# Patient Record
Sex: Male | Born: 1980 | Race: Black or African American | Hispanic: No | Marital: Single | Smoking: Current every day smoker
Health system: Southern US, Community
[De-identification: ages and names within clinical notes are randomized; demographics above are authoritative.]

## PROBLEM LIST (undated history)

## (undated) DIAGNOSIS — Z789 Other specified health status: Secondary | ICD-10-CM

## (undated) HISTORY — DX: Other specified health status: Z78.9

## (undated) HISTORY — PX: NO PAST SURGERIES: SHX2092

---

## 2012-04-07 ENCOUNTER — Emergency Department: Payer: Self-pay | Admitting: Emergency Medicine

## 2013-10-25 ENCOUNTER — Emergency Department: Payer: Self-pay | Admitting: Emergency Medicine

## 2013-10-25 LAB — URINALYSIS, COMPLETE
BILIRUBIN, UR: NEGATIVE
Bacteria: NEGATIVE
GLUCOSE, UR: NEGATIVE mg/dL (ref 0–75)
Ketone: NEGATIVE
Leukocyte Esterase: NEGATIVE
NITRITE: NEGATIVE
PH: 5 (ref 4.5–8.0)
Protein: NEGATIVE
Specific Gravity: 1.018 (ref 1.003–1.030)
WBC UR: NONE SEEN /HPF (ref 0–5)

## 2013-10-25 LAB — BASIC METABOLIC PANEL
Anion Gap: 2 — ABNORMAL LOW (ref 7–16)
BUN: 18 mg/dL (ref 7–18)
CALCIUM: 8.9 mg/dL (ref 8.5–10.1)
CHLORIDE: 109 mmol/L — AB (ref 98–107)
CO2: 27 mmol/L (ref 21–32)
Creatinine: 0.87 mg/dL (ref 0.60–1.30)
EGFR (African American): >60
EGFR (Non-African Amer.): >60
GLUCOSE: 84 mg/dL (ref 65–99)
Osmolality: 277 (ref 275–301)
POTASSIUM: 4 mmol/L (ref 3.5–5.1)
SODIUM: 138 mmol/L (ref 136–145)

## 2013-10-25 LAB — CBC
HCT: 42.4 % (ref 40.0–52.0)
HGB: 14.1 g/dL (ref 13.0–18.0)
MCH: 30.8 pg (ref 26.0–34.0)
MCHC: 33.3 g/dL (ref 32.0–36.0)
MCV: 93 fL (ref 80–100)
Platelet: 173 10*3/uL (ref 150–440)
RBC: 4.58 10*6/uL (ref 4.40–5.90)
RDW: 12.9 % (ref 11.5–14.5)
WBC: 6.2 10*3/uL (ref 3.8–10.6)

## 2017-02-15 ENCOUNTER — Encounter: Payer: Self-pay | Admitting: Emergency Medicine

## 2017-02-15 ENCOUNTER — Emergency Department
Admission: EM | Admit: 2017-02-15 | Discharge: 2017-02-15 | Disposition: A | Discharge status: Home / Self Care | Payer: BLUE CROSS/BLUE SHIELD | Attending: Emergency Medicine | Admitting: Emergency Medicine

## 2017-02-15 ENCOUNTER — Emergency Department: Payer: BLUE CROSS/BLUE SHIELD

## 2017-02-15 DIAGNOSIS — Y939 Activity, unspecified: Secondary | ICD-10-CM | POA: Insufficient documentation

## 2017-02-15 DIAGNOSIS — S299XXA Unspecified injury of thorax, initial encounter: Secondary | ICD-10-CM | POA: Diagnosis present

## 2017-02-15 DIAGNOSIS — Y999 Unspecified external cause status: Secondary | ICD-10-CM | POA: Insufficient documentation

## 2017-02-15 DIAGNOSIS — S29012A Strain of muscle and tendon of back wall of thorax, initial encounter: Secondary | ICD-10-CM | POA: Diagnosis not present

## 2017-02-15 DIAGNOSIS — X58XXXA Exposure to other specified factors, initial encounter: Secondary | ICD-10-CM | POA: Insufficient documentation

## 2017-02-15 DIAGNOSIS — R0789 Other chest pain: Secondary | ICD-10-CM

## 2017-02-15 DIAGNOSIS — F17210 Nicotine dependence, cigarettes, uncomplicated: Secondary | ICD-10-CM | POA: Diagnosis not present

## 2017-02-15 DIAGNOSIS — Y929 Unspecified place or not applicable: Secondary | ICD-10-CM | POA: Diagnosis not present

## 2017-02-15 DIAGNOSIS — S29019A Strain of muscle and tendon of unspecified wall of thorax, initial encounter: Secondary | ICD-10-CM

## 2017-02-15 LAB — BASIC METABOLIC PANEL
Anion gap: 8 (ref 5–15)
BUN: 15 mg/dL (ref 6–20)
CHLORIDE: 103 mmol/L (ref 101–111)
CO2: 27 mmol/L (ref 22–32)
CREATININE: 0.91 mg/dL (ref 0.61–1.24)
Calcium: 9.6 mg/dL (ref 8.9–10.3)
GFR calc non Af Amer: >60 mL/min (ref >60)
GLUCOSE: 97 mg/dL (ref 65–99)
Potassium: 3.7 mmol/L (ref 3.5–5.1)
Sodium: 138 mmol/L (ref 135–145)

## 2017-02-15 LAB — TROPONIN I: Troponin I: <0.03 ng/mL (ref <0.03)

## 2017-02-15 LAB — CBC
HCT: 40.9 % (ref 40.0–52.0)
Hemoglobin: 14.1 g/dL (ref 13.0–18.0)
MCH: 32 pg (ref 26.0–34.0)
MCHC: 34.4 g/dL (ref 32.0–36.0)
MCV: 93.1 fL (ref 80.0–100.0)
PLATELETS: 180 10*3/uL (ref 150–440)
RBC: 4.4 MIL/uL (ref 4.40–5.90)
RDW: 12.5 % (ref 11.5–14.5)
WBC: 7.3 10*3/uL (ref 3.8–10.6)

## 2017-02-15 MED ORDER — DIAZEPAM 5 MG PO TABS
10.0000 mg | ORAL_TABLET | Freq: Once | ORAL | Status: AC
  Administered 2017-02-15: 10 mg via ORAL
  Filled 2017-02-15: qty 2

## 2017-02-15 MED ORDER — IBUPROFEN 800 MG PO TABS: 800.0000 mg | ORAL_TABLET | Freq: Three times a day (TID) | ORAL | 0 refills | Status: DC | PRN

## 2017-02-15 MED ORDER — DIAZEPAM 5 MG PO TABS: 5.0000 mg | ORAL_TABLET | Freq: Three times a day (TID) | ORAL | 0 refills | Status: DC | PRN

## 2017-02-15 MED ORDER — KETOROLAC TROMETHAMINE 60 MG/2ML IM SOLN
60.0000 mg | Freq: Once | INTRAMUSCULAR | Status: AC
  Administered 2017-02-15: 60 mg via INTRAMUSCULAR
  Filled 2017-02-15: qty 2

## 2017-02-15 NOTE — ED Notes (Signed)
ED Provider at bedside. 

## 2017-02-15 NOTE — ED Triage Notes (Signed)
Pt c/o right back pain and central chest pain that is intermittent today. SHOB comes when pain does. Ambulatory to triage without difficulty. NAD at this time.  Skin warm and dry. Respirations unlabored.

## 2017-02-15 NOTE — ED Provider Notes (Signed)
[facility] Emergency Department Provider Note       Time seen: ----------------------------------------- 7:07 PM on 02/15/2017 -----------------------------------------     I have reviewed the triage vital signs and the nursing notes.   HISTORY   Chief Complaint Chest Pain    HPI Russell Phillips is a 36 y.o. male who presents to the ED for right back and chest pain that has been intermittent today. He has shortness of breath that comes and goes. Patient states earlier this morning he had the right upper back pain but then he took a nap and woke up with right-sided chest pain. He denies fevers, chills, vomiting or diarrhea. Patient does heavy lifting on his job.   History reviewed. No pertinent past medical history.  There are no active problems to display for this patient.   History reviewed. No pertinent surgical history.  Allergies Patient has no known allergies.  Social History Social History  Substance Use Topics  . Smoking status: Current Every Day Smoker  . Smokeless tobacco: Never Used  . Alcohol use Yes    Review of Systems Constitutional: Negative for fever. Eyes: Negative for vision changes ENT:  Negative for congestion, sore throat Cardiovascular: Positive for chest pain Respiratory: Negative for shortness of breath. Gastrointestinal: Negative for abdominal pain, vomiting and diarrhea. Genitourinary: Negative for dysuria. Musculoskeletal: Positive for right upper back pain Skin: Negative for rash. Neurological: Negative for headaches, focal weakness or numbness.  All systems negative/normal/unremarkable except as stated in the HPI  ____________________________________________   PHYSICAL EXAM:  VITAL SIGNS: ED Triage Vitals  Enc Vitals Group     BP 02/15/17 1620 124/60     Pulse Rate 02/15/17 1620 64     Resp 02/15/17 1620 16     Temp 02/15/17 1620 99 F (37.2 C)     Temp Source 02/15/17 1620 Oral     SpO2  02/15/17 1620 99 %     Weight 02/15/17 1618 160 lb (72.6 kg)     Height 02/15/17 1618 5\' 6"  (1.676 m)     Head Circumference --      Peak Flow --      Pain Score 02/15/17 1618 8     Pain Loc --      Pain Edu? --      Excl. in GC? --     Constitutional: Alert and oriented. Well appearing and in no distress. Eyes: Conjunctivae are normal. Normal extraocular movements. ENT   Head: Normocephalic and atraumatic.   Nose: No congestion/rhinnorhea.   Mouth/Throat: Mucous membranes are moist.   Neck: No stridor. Cardiovascular: Normal rate, regular rhythm. No murmurs, rubs, or gallops. Respiratory: Normal respiratory effort without tachypnea nor retractions. Breath sounds are clear and equal bilaterally. No wheezes/rales/rhonchi. Gastrointestinal: Soft and nontender. Normal bowel sounds Musculoskeletal: Nontender with normal range of motion in extremities. No lower extremity tenderness nor edema. Palpable muscle spasm in the thoracic spine and periscapular area, right anterolateral chest wall tenderness. Neurologic:  Normal speech and language. No gross focal neurologic deficits are appreciated.  Skin:  Skin is warm, dry and intact. No rash noted. Psychiatric: Mood and affect are normal. Speech and behavior are normal.  ____________________________________________  EKG: Interpreted by me. Sinus rhythm with a rate of 63 bpm, normal PR normal, normal QRS, normal QT.  ____________________________________________  ED COURSE:  Pertinent labs & imaging results that were available during my care of the patient were reviewed by me and considered in my medical decision making (see  chart for details). Patient presents for chest and back pain, we will assess with labs and imaging as indicated.   Procedures ____________________________________________   LABS (pertinent positives/negatives)  Labs Reviewed  BASIC METABOLIC PANEL  CBC  TROPONIN I    RADIOLOGY  Chest x-ray   IMPRESSION: Mild peribronchial cuffing. Mild bronchitis cannot be excluded. Exam is otherwise unremarkable.  ____________________________________________  FINAL ASSESSMENT AND PLAN  Thoracic muscle strain, chest wall pain  Plan: Patient's labs and imaging were dictated above. Patient had presented for chest and back pain which appears to be musculoskeletal as that he does heavy lifting. He was given anti-inflammatory muscle relaxants, is encouraged to use heating pad, massage and stretching.   Emily FilbertWilliams, Jonathan E, MD   Note: This note was generated in part or whole with voice recognition software. Voice recognition is usually quite accurate but there are transcription errors that can and very often do occur. I apologize for any typographical errors that were not detected and corrected.     Emily FilbertWilliams, Jonathan E, MD 02/15/17 [phone]

## 2017-07-05 ENCOUNTER — Emergency Department (HOSPITAL_COMMUNITY)
Admission: EM | Admit: 2017-07-05 | Discharge: 2017-07-05 | Disposition: A | Discharge status: Home / Self Care | Payer: BLUE CROSS/BLUE SHIELD | Attending: Emergency Medicine | Admitting: Emergency Medicine

## 2017-07-05 ENCOUNTER — Emergency Department (HOSPITAL_COMMUNITY): Payer: BLUE CROSS/BLUE SHIELD

## 2017-07-05 ENCOUNTER — Encounter (HOSPITAL_COMMUNITY): Payer: Self-pay | Admitting: Emergency Medicine

## 2017-07-05 DIAGNOSIS — M533 Sacrococcygeal disorders, not elsewhere classified: Secondary | ICD-10-CM | POA: Insufficient documentation

## 2017-07-05 DIAGNOSIS — F17200 Nicotine dependence, unspecified, uncomplicated: Secondary | ICD-10-CM | POA: Diagnosis not present

## 2017-07-05 DIAGNOSIS — M545 Low back pain, unspecified: Secondary | ICD-10-CM

## 2017-07-05 MED ORDER — IBUPROFEN 400 MG PO TABS
800.0000 mg | ORAL_TABLET | Freq: Once | ORAL | Status: AC
  Administered 2017-07-05: 800 mg via ORAL
  Filled 2017-07-05: qty 2

## 2017-07-05 NOTE — ED Notes (Signed)
Patient transported to X-ray 
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
## 2017-07-05 NOTE — Discharge Instructions (Signed)
Take Tylenol every 4 hours and ibuprofen every 6 hours as needed for pain. Use ice as needed. He will need a work note for today. He will be more sore tomorrow. If you develop new concerns or discover new injuries please return to the ER or see her physician.
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
## 2020-06-28 ENCOUNTER — Other Ambulatory Visit (INDEPENDENT_AMBULATORY_CARE_PROVIDER_SITE_OTHER): Payer: Self-pay | Admitting: Nurse Practitioner

## 2020-06-28 DIAGNOSIS — I8390 Asymptomatic varicose veins of unspecified lower extremity: Secondary | ICD-10-CM

## 2020-06-29 ENCOUNTER — Encounter (INDEPENDENT_AMBULATORY_CARE_PROVIDER_SITE_OTHER): Payer: Self-pay | Admitting: Nurse Practitioner

## 2020-06-29 ENCOUNTER — Other Ambulatory Visit: Payer: Self-pay

## 2020-06-29 ENCOUNTER — Ambulatory Visit (INDEPENDENT_AMBULATORY_CARE_PROVIDER_SITE_OTHER): Payer: BC Managed Care – PPO

## 2020-06-29 ENCOUNTER — Ambulatory Visit (INDEPENDENT_AMBULATORY_CARE_PROVIDER_SITE_OTHER): Payer: BC Managed Care – PPO | Admitting: Nurse Practitioner

## 2020-06-29 VITALS — BP 144/88 | HR 62 | Resp 16 | Ht 66.0 in | Wt 140.4 lb

## 2020-06-29 DIAGNOSIS — I8390 Asymptomatic varicose veins of unspecified lower extremity: Secondary | ICD-10-CM

## 2020-07-04 ENCOUNTER — Encounter (INDEPENDENT_AMBULATORY_CARE_PROVIDER_SITE_OTHER): Payer: Self-pay | Admitting: Nurse Practitioner

## 2020-07-04 NOTE — Progress Notes (Signed)
Subjective:    Patient ID: Russell Phillips, male    DOB: Sep 09, 1981, 39 y.o.   MRN: [id number] Chief Complaint  Patient presents with  . New Patient (Initial Visit)    ref [facility] ble reflux ultrasound     The patient is seen for evaluation of symptomatic varicose veins. The patient relates burning and stinging which worsened steadily throughout the course of the day, particularly with standing. The patient also notes an aching and throbbing pain over the varicosities, particularly with prolonged dependent positions. The symptoms are significantly improved with elevation.  The patient also notes that during hot weather the symptoms are greatly intensified and this is worsened within the last month. The patient states the pain from the varicose veins interferes with work, daily exercise, shopping and household maintenance. At this point, the symptoms are persistent and severe enough that they're having a negative impact on lifestyle and are interfering with daily activities.  There is no history of DVT, PE or superficial thrombophlebitis. There is no history of ulceration or hemorrhage. The patient denies a significant family history of varicose veins.   The patient has not worn graduated compression in the past. At the present time the patient has not been using over-the-counter analgesics. There is no history of prior surgical intervention or sclerotherapy.  Today noninvasive studies show no evidence of DVT or superficial venous stenosis bilaterally.  No evidence of superficial venous reflux seen in the bilateral great or short saphenous veins bilaterally.  No evidence of deep venous insufficiency seen bilaterally.  It is noted that there are multiple superficial varicosities in the medial aspect of the mid and distal thigh.   Review of Systems  Cardiovascular: Positive for leg swelling.  All other systems reviewed and are negative.      Objective:   Physical Exam Vitals reviewed.   HENT:     Head: Normocephalic.  Cardiovascular:     Rate and Rhythm: Normal rate.     Pulses: Normal pulses.     Comments: Large superficial varicosities in the left lower extremity Pulmonary:     Effort: Pulmonary effort is normal.  Musculoskeletal:        General: Tenderness present.  Neurological:     Mental Status: He is alert and oriented to person, place, and time.  Psychiatric:        Mood and Affect: Mood normal.        Behavior: Behavior normal.        Thought Content: Thought content normal.        Judgment: Judgment normal.     BP (!) 144/88 (BP Location: Right Arm)   Pulse 62   Resp 16   Ht 5\' 6"  (1.676 m)   Wt 140 lb 6.4 oz (63.7 kg)   BMI 22.66 kg/m   Past Medical History:  Diagnosis Date  . No pertinent past medical history     Social History   Socioeconomic History  . Marital status: Single    Spouse name: Not on file  . Number of children: Not on file  . Years of education: Not on file  . Highest education level: Not on file  Occupational History  . Not on file  Tobacco Use  . Smoking status: Current Every Day Smoker  . Smokeless tobacco: Never Used  Substance and Sexual Activity  . Alcohol use: Yes  . Drug use: No  . Sexual activity: Not on file  Other Topics Concern  . Not on file  Social History Narrative  . Not on file   Social Determinants of Health   Financial Resource Strain:   . Difficulty of Paying Living Expenses: Not on file  Food Insecurity:   . Worried About Programme researcher, broadcasting/film/video in the Last Year: Not on file  . Ran Out of Food in the Last Year: Not on file  Transportation Needs:   . Lack of Transportation (Medical): Not on file  . Lack of Transportation (Non-Medical): Not on file  Physical Activity:   . Days of Exercise per Week: Not on file  . Minutes of Exercise per Session: Not on file  Stress:   . Feeling of Stress : Not on file  Social Connections:   . Frequency of Communication with Friends and Family: Not on  file  . Frequency of Social Gatherings with Friends and Family: Not on file  . Attends Religious Services: Not on file  . Active Member of Clubs or Organizations: Not on file  . Attends Banker Meetings: Not on file  . Marital Status: Not on file  Intimate Partner Violence:   . Fear of Current or Ex-Partner: Not on file  . Emotionally Abused: Not on file  . Physically Abused: Not on file  . Sexually Abused: Not on file    Past Surgical History:  Procedure Laterality Date  . NO PAST SURGERIES      Family History  Problem Relation Age of Onset  . Cancer Mother     No Known Allergies     Assessment & Plan:   1. Varicose veins of lower extremity, unspecified laterality, unspecified whether complicated Recommend:  The patient is complaining of varicose veins.    I have had a long discussion with the patient regarding  varicose veins and why they cause symptoms.  Patient will begin wearing graduated compression stockings on a daily basis, beginning first thing in the morning and removing them in the evening. The patient is instructed specifically not to sleep in the stockings.    The patient  will also begin using over-the-counter analgesics such as Motrin 600 mg po TID to help control the symptoms as needed.    In addition, behavioral modification including elevation during the day will be initiated, utilizing a recliner was recommended.  The patient is also instructed to continue exercising such as walking 4-5 times per week.  At this time because there is no presence of venous reflux on ultrasound, the patient will continue conservative therapy  The Patient will follow up PRN if the symptoms worsen.   Current Outpatient Medications on File Prior to Visit  Medication Sig Dispense Refill  . diazepam (VALIUM) 5 MG tablet Take 1 tablet (5 mg total) by mouth every 8 (eight) hours as needed for muscle spasms. (Patient not taking: Reported on 07/05/2017) 20 tablet 0   . ibuprofen (ADVIL,MOTRIN) 800 MG tablet Take 1 tablet (800 mg total) by mouth every 8 (eight) hours as needed. (Patient not taking: Reported on 07/05/2017) 30 tablet 0   No current facility-administered medications on file prior to visit.    There are no Patient Instructions on file for this visit. No follow-ups on file.   Georgiana Spinner, NP

## 2020-07-22 ENCOUNTER — Ambulatory Visit: Payer: Self-pay | Admitting: Family Medicine

## 2020-09-01 ENCOUNTER — Ambulatory Visit: Payer: Self-pay | Admitting: Family Medicine

## 2022-08-23 ENCOUNTER — Encounter (HOSPITAL_COMMUNITY): Payer: Self-pay

## 2022-08-23 ENCOUNTER — Other Ambulatory Visit: Payer: Self-pay

## 2022-08-23 ENCOUNTER — Emergency Department (HOSPITAL_COMMUNITY)
Admission: EM | Admit: 2022-08-23 | Discharge: 2022-08-23 | Disposition: A | Discharge status: Home / Self Care | Payer: BC Managed Care – PPO | Attending: Emergency Medicine | Admitting: Emergency Medicine

## 2022-08-23 ENCOUNTER — Emergency Department (HOSPITAL_COMMUNITY): Payer: BC Managed Care – PPO

## 2022-08-23 ENCOUNTER — Ambulatory Visit (HOSPITAL_COMMUNITY): Payer: BC Managed Care – PPO

## 2022-08-23 DIAGNOSIS — M79605 Pain in left leg: Secondary | ICD-10-CM | POA: Insufficient documentation

## 2022-08-23 DIAGNOSIS — R079 Chest pain, unspecified: Secondary | ICD-10-CM | POA: Insufficient documentation

## 2022-08-23 LAB — BASIC METABOLIC PANEL
Anion gap: 8 (ref 5–15)
BUN: 13 mg/dL (ref 6–20)
CO2: 26 mmol/L (ref 22–32)
Calcium: 9 mg/dL (ref 8.9–10.3)
Chloride: 107 mmol/L (ref 98–111)
Creatinine, Ser: 0.92 mg/dL (ref 0.61–1.24)
GFR, Estimated: >60 mL/min (ref >60)
Glucose, Bld: 77 mg/dL (ref 70–99)
Potassium: 3.7 mmol/L (ref 3.5–5.1)
Sodium: 141 mmol/L (ref 135–145)

## 2022-08-23 LAB — CBC
HCT: 39.1 % (ref 39.0–52.0)
Hemoglobin: 13.5 g/dL (ref 13.0–17.0)
MCH: 33 pg (ref 26.0–34.0)
MCHC: 34.5 g/dL (ref 30.0–36.0)
MCV: 95.6 fL (ref 80.0–100.0)
Platelets: 185 10*3/uL (ref 150–400)
RBC: 4.09 MIL/uL — ABNORMAL LOW (ref 4.22–5.81)
RDW: 13 % (ref 11.5–15.5)
WBC: 5.6 10*3/uL (ref 4.0–10.5)
nRBC: 0 % (ref 0.0–0.2)

## 2022-08-23 LAB — TROPONIN I (HIGH SENSITIVITY): Troponin I (High Sensitivity): 3 ng/L (ref <18)

## 2022-08-23 LAB — D-DIMER, QUANTITATIVE: D-Dimer, Quant: <0.27 ug/mL-FEU (ref 0.00–0.50)

## 2022-08-23 NOTE — ED Notes (Signed)
RN reviewed discharge instructions with pt. Pt verbalized understanding and had no further questions. VSS upon discharge.  

## 2022-08-23 NOTE — ED Triage Notes (Signed)
Pt c/o left leg swelling x 1 year. Pt seen for same earlier in the year, states they told him he didn't have a blood clot. Pt states he has pain in leg intermittently. Pt ambulatory to triage, NAD noted.

## 2022-08-23 NOTE — ED Provider Notes (Signed)
[facility] EMERGENCY DEPARTMENT Provider Note   CSN: [id number] Arrival date & time: 08/23/22  1820     History  Chief Complaint  Patient presents with   Leg Swelling    Russell Phillips is a 41 y.o. male.  HPI   Patient with history of tobacco abuse but no other comorbidities presents today due to chest pain and left lower extremity pain and swelling.  Patient states the left lower extremity pain has been going on intermittently for a year, describes as a cramping sensation.  He feels it mostly to the posterior calf but also around the medial aspect of his left ankle.  Has not noticed any skin discoloration, states anti-inflammatories and elevating helps.  Had ultrasound done last year which was negative, no known history of blood clots.  Denies any recent travel or surgeries.  The chest pain started a few days ago, feels like a cramping pain and its midsternal.  Does not radiate elsewhere, no nausea or vomiting.  He endorses intermittent cough but nonproductive.  No fevers or chills.  No history of ACS.  Home Medications Prior to Admission medications   Medication Sig Start Date End Date Taking? Authorizing Provider  diazepam (VALIUM) 5 MG tablet Take 1 tablet (5 mg total) by mouth every 8 (eight) hours as needed for muscle spasms. Patient not taking: Reported on 07/05/2017 02/15/17   Emily Filbert, MD  ibuprofen (ADVIL,MOTRIN) 800 MG tablet Take 1 tablet (800 mg total) by mouth every 8 (eight) hours as needed. Patient not taking: Reported on 07/05/2017 02/15/17   Emily Filbert, MD      Allergies    Patient has no known allergies.    Review of Systems   Review of Systems  Physical Exam Updated Vital Signs BP (!) 151/86 (BP Location: Right Arm)   Pulse 60   Temp 98.1 F (36.7 C) (Oral)   Resp 16   SpO2 100%  Physical Exam Vitals and nursing note reviewed. Exam conducted with a chaperone present.  Constitutional:      Appearance: Normal  appearance.  HENT:     Head: Normocephalic and atraumatic.  Eyes:     General: No scleral icterus.       Right eye: No discharge.        Left eye: No discharge.     Extraocular Movements: Extraocular movements intact.     Pupils: Pupils are equal, round, and reactive to light.  Cardiovascular:     Rate and Rhythm: Normal rate and regular rhythm.     Pulses: Normal pulses.     Heart sounds: Normal heart sounds.     No friction rub. No gallop.  Pulmonary:     Effort: Pulmonary effort is normal. No respiratory distress.     Breath sounds: Normal breath sounds.  Abdominal:     General: Abdomen is flat. Bowel sounds are normal. There is no distension.     Palpations: Abdomen is soft.     Tenderness: There is no abdominal tenderness.  Musculoskeletal:     Comments: Lower extremities are roughly symmetric bilaterally.  Slight posterior calf tenderness to left lower extremity.  Skin:    General: Skin is warm and dry.     Capillary Refill: Capillary refill takes less than 2 seconds.     Coloration: Skin is not jaundiced.  Neurological:     Mental Status: He is alert. Mental status is at baseline.     Coordination: Coordination normal.  ED Results / Procedures / Treatments   Labs (all labs ordered are listed, but only abnormal results are displayed) Labs Reviewed  CBC - Abnormal; Notable for the following components:      Result Value   RBC 4.09 (*)    All other components within normal limits  BASIC METABOLIC PANEL  D-DIMER, QUANTITATIVE  TROPONIN I (HIGH SENSITIVITY)    EKG EKG Interpretation  Date/Time:  Thursday August 23 2022 18:57:48 EST Ventricular Rate:  58 PR Interval:  144 QRS Duration: 92 QT Interval:  376 QTC Calculation: 369 R Axis:   89 Text Interpretation: Sinus bradycardia Minimal voltage criteria for LVH, may be normal variant ( Sokolow-Lyon ) Borderline ECG When compared with ECG of 15-Feb-2017 16:22, PREVIOUS ECG IS PRESENT Confirmed by Eber Hong ([id number]) on 08/23/2022 9:03:24 PM  Radiology DG Chest 1 View  Result Date: 08/23/2022 CLINICAL DATA:  Chest pain EXAM: CHEST  1 VIEW COMPARISON:  07/05/2018 FINDINGS: The heart size and mediastinal contours are within normal limits. Both lungs are clear. The visualized skeletal structures are unremarkable. IMPRESSION: No acute abnormality of the lungs in AP portable projection. Electronically Signed   By: Jearld Lesch M.D.   On: 08/23/2022 20:02    Procedures Procedures    Medications Ordered in ED Medications - No data to display  ED Course/ Medical Decision Making/ A&P                           Medical Decision Making Amount and/or Complexity of Data Reviewed Labs: ordered.   This is a 41 year old male presenting today chief left lower extremity swelling/pain x 1 year and chest pain x 2 to 3 days.  Differential is broad and includes but not limited to ACS, PE, pneumonia, DVT, ischemic limb, dissection.   On exam patient is well-appearing, do not appreciate any size difference tween his lower extremities and he has palpable pulses bilaterally.  There is no erythema or cellulitis.  Lungs are clear to auscultation, peripheral pulses are symmetric bilaterally he is not hypoxic or tachycardic.  I ordered, viewed and interpreted laboratory workup. CBC without cytosis or anemia. Negative troponin. BMP without gross electrolyte arrangement or AKI. Dimer is negative.    EKG shows sinus rhythm without any specific ischemic changes.  Chest x-ray negative for acute process.  Agree with radiologist. I reevaluate the patient who is unchanged.    I do not think this is ACS given the presentation, no EKG changes, negative troponin - his heart score is low risk at 1.  Given night dimer is negative, not tachycardic or hypoxic and he is also PERC negative I do not think this is a PE or DVT.  Considered ultrasound but they gave left for the evening and very low suspicion for DVT.    Presentation is not consistent with a dissection, esophageal rupture-no signs of pneumonia or infectious etiology on his plain film.  Patient is stable for outpatient follow-up at this time.        Final Clinical Impression(s) / ED Diagnoses Final diagnoses:  Chest pain, unspecified type  Left leg pain    Rx / DC Orders ED Discharge Orders     None         Theron Arista, Cordelia Poche 08/23/22 2142    Eber Hong, MD 08/25/22 1223

## 2022-08-23 NOTE — ED Provider Triage Note (Signed)
Emergency Medicine Provider Triage Evaluation Note  Russell Phillips , a 41 y.o. male  was evaluated in triage.  Pt complains of left leg swelling has been on and off for the past year.  He also started having right-sided chest pains that are sharp over the past 2 days.  He says they come and go.  Nothing makes it better or worse.  Pain does not radiate.  No other associated symptoms.  Review of Systems  Positive:  Negative:   Physical Exam  BP (!) 151/86 (BP Location: Right Arm)   Pulse 60   Temp 98.1 F (36.7 C) (Oral)   Resp 16   SpO2 100%  Gen:   Awake, no distress   Resp:  Normal effort  MSK:   Moves extremities without difficulty  Other:  Left leg without any notable swelling in comparison to the right, no notable tenderness behind calf.  Medical Decision Making  Medically screening exam initiated at 7:20 PM.  Appropriate orders placed.  Russell Phillips was informed that the remainder of the evaluation will be completed by another provider, this initial triage assessment does not replace that evaluation, and the importance of remaining in the ED until their evaluation is complete.     Claudie Leach, [place] 08/23/22 1921

## 2022-08-23 NOTE — Discharge Instructions (Addendum)
You are seen today in the emergency department for chest pain.  Your workup was reassuring, follow-up with your main doctor for reevaluation.  Return smoking cessation.  Come back to the ED if you have new or concerning symptoms.

## 2023-08-18 ENCOUNTER — Emergency Department: Payer: BC Managed Care – PPO

## 2023-08-18 ENCOUNTER — Emergency Department
Admission: EM | Admit: 2023-08-18 | Discharge: 2023-08-18 | Disposition: A | Discharge status: Home / Self Care | Payer: BC Managed Care – PPO | Attending: Emergency Medicine | Admitting: Emergency Medicine

## 2023-08-18 ENCOUNTER — Other Ambulatory Visit: Payer: Self-pay

## 2023-08-18 DIAGNOSIS — N492 Inflammatory disorders of scrotum: Secondary | ICD-10-CM | POA: Insufficient documentation

## 2023-08-18 DIAGNOSIS — N50811 Right testicular pain: Secondary | ICD-10-CM | POA: Diagnosis present

## 2023-08-18 LAB — URINALYSIS, ROUTINE W REFLEX MICROSCOPIC
Bilirubin Urine: NEGATIVE
Glucose, UA: NEGATIVE mg/dL
Ketones, ur: NEGATIVE mg/dL
Nitrite: POSITIVE — AB
Protein, ur: NEGATIVE mg/dL
Specific Gravity, Urine: 1.021 (ref 1.005–1.030)
pH: 5 (ref 5.0–8.0)

## 2023-08-18 LAB — CHLAMYDIA/NGC RT PCR (ARMC ONLY)
Chlamydia Tr: NOT DETECTED
N gonorrhoeae: NOT DETECTED

## 2023-08-18 MED ORDER — CEPHALEXIN 500 MG PO CAPS
500.0000 mg | ORAL_CAPSULE | Freq: Once | ORAL | Status: AC
  Administered 2023-08-18: 500 mg via ORAL
  Filled 2023-08-18: qty 1

## 2023-08-18 MED ORDER — OXYCODONE HCL 5 MG PO TABS
5.0000 mg | ORAL_TABLET | Freq: Once | ORAL | Status: DC
  Filled 2023-08-18: qty 1

## 2023-08-18 MED ORDER — CEPHALEXIN 500 MG PO CAPS: 500.0000 mg | ORAL_CAPSULE | Freq: Three times a day (TID) | ORAL | 0 refills | Status: AC

## 2023-08-18 MED ORDER — KETOROLAC TROMETHAMINE 30 MG/ML IJ SOLN
30.0000 mg | Freq: Once | INTRAMUSCULAR | Status: AC
  Administered 2023-08-18: 30 mg via INTRAMUSCULAR
  Filled 2023-08-18: qty 1

## 2023-08-18 NOTE — Discharge Instructions (Addendum)
Keflex antibiotics 3 times daily for 7 days. Finish all 21 pills.   Follow up with the Urologist in the clinic  Return to the ED if you develop fevers or progressively worsening symptoms.  Inability to pee.  Please take Tylenol and ibuprofen/Advil for your pain.  It is safe to take them together, or to alternate them every few hours.  Take up to 1000mg  of Tylenol at a time, up to 4 times per day.  Do not take more than 4000 mg of Tylenol in 24 hours.  For ibuprofen, take 400-600 mg, 3 - 4 times per day.

## 2023-08-18 NOTE — ED Triage Notes (Signed)
Pt reports testicle pain and swelling x 2 days, pt denies penile discharge or injury to area. Pt states 2 months ago he had varicose vein surgery on left thigh.

## 2023-08-18 NOTE — ED Provider Notes (Signed)
[facility] Provider Note    Event Date/Time   First MD Initiated Contact with Patient 08/18/23 [phone]     (approximate)   History   Testicle Pain   HPI  Russell Phillips is a 42 y.o. male who presents to the ED for evaluation of Testicle Pain   Patient presents to the ED for evaluation of 2 days of scrotal swelling, redness and pain.  Denies any abdominal, pelvic or inguinal discomfort.  No penile symptoms such as dysuria, swelling or pain, discharge.   Reports inferior aspect of his scrotum, left greater than right, swelling, redness, pain with walking.   Physical Exam   Triage Vital Signs: ED Triage Vitals  Encounter Vitals Group     BP 08/18/23 0410 136/79     Systolic BP Percentile --      Diastolic BP Percentile --      Pulse Rate 08/18/23 0410 92     Resp 08/18/23 0410 18     Temp 08/18/23 0410 98.5 F (36.9 C)     Temp Source 08/18/23 0410 Oral     SpO2 08/18/23 0410 100 %     Weight 08/18/23 0410 150 lb (68 kg)     Height 08/18/23 0410 5\' 6"  (1.676 m)     Head Circumference --      Peak Flow --      Pain Score 08/18/23 0410 8     Pain Loc --      Pain Education --      Exclude from Growth Chart --     Most recent vital signs: Vitals:   08/18/23 0410 08/18/23 0621  BP: 136/79 (!) 114/59  Pulse: 92 68  Resp: 18 18  Temp: 98.5 F (36.9 C) 98.3 F (36.8 C)  SpO2: 100% 95%    General: Awake, no distress.  CV:  Good peripheral perfusion.  Resp:  Normal effort.  Abd:  No distention.  MSK:  No deformity noted.  Neuro:  No focal deficits appreciated. Other:  Chaperoned exam demonstrates no inguinal masses, swelling or pathology.  Circumcised penis without rash, skin changes or swelling.  Scrotum appears cellulitic, left greater than right, with swelling, erythema, tenderness and induration.   ED Results / Procedures / Treatments   Labs (all labs ordered are listed, but only abnormal results are displayed) Labs Reviewed   URINALYSIS, ROUTINE W REFLEX MICROSCOPIC - Abnormal; Notable for the following components:      Result Value   Color, Urine YELLOW (*)    APPearance HAZY (*)    Hgb urine dipstick MODERATE (*)    Nitrite POSITIVE (*)    Leukocytes,Ua MODERATE (*)    Bacteria, UA FEW (*)    All other components within normal limits  CHLAMYDIA/NGC RT PCR ([facility] ONLY)            URINE CULTURE    EKG   RADIOLOGY Scrotal ultrasound interpreted by me without clear signs of acute pathology  Official radiology report(s): US SCROTUM W/DOPPLER  Result Date: 08/18/2023 CLINICAL DATA:  Scrotal swelling. EXAM: SCROTAL ULTRASOUND DOPPLER ULTRASOUND OF THE TESTICLES TECHNIQUE: Complete ultrasound examination of the testicles, epididymis, and other scrotal structures was performed. Color and spectral Doppler ultrasound were also utilized to evaluate blood flow to the testicles. COMPARISON:  10/25/2013 FINDINGS: Right testicle Measurements: 44 x 15 x 22 mm.  No mass or abnormal vascularity. Left testicle Measurements:  40 x 22 x 27 mm.  No mass or abnormal vascularity.  Right epididymis:  Normal in size and appearance. Left epididymis: Somewhat thicker than the right with equivocal increased vascularity. Hydrocele:  Small volume and simple on the left Varicocele:  Present on both sides on a chronic basis. Pulsed Doppler interrogation of both testes demonstrates normal low resistance arterial and venous waveforms bilaterally. IMPRESSION: 1. Prominent bilateral varicocele, also noted in 2015. 2. Equivocal for left epididymitis. 3. Normal testicular blood flow. Electronically Signed   By: Tiburcio Pea M.D.   On: 08/18/2023 05:20    PROCEDURES and INTERVENTIONS:  Procedures  Medications  cephALEXin (KEFLEX) capsule 500 mg (500 mg Oral Given 08/18/23 0430)  ketorolac (TORADOL) 30 MG/ML injection 30 mg (30 mg Intramuscular Given 08/18/23 0430)     IMPRESSION / MDM / ASSESSMENT AND PLAN / ED COURSE  I reviewed the triage  vital signs and the nursing notes.  Differential diagnosis includes, but is not limited to, scrotal cellulitis, STI such as chlamydia, acute cystitis, trauma, torsion  {Patient presents with symptoms of an acute illness or injury that is potentially life-threatening.  42 year old presents with a couple days of progressive pain, swelling and erythema to his scrotum consistent with cellulitis and suitable for outpatient management with antibiotics and urology follow-up.  While he has no urinary symptoms such as dysuria, his urinalysis is concerning for infection and it is sent for culture.  Scrotal ultrasound questions equivocal epididymitis but is largely normal.  GC testing is negative.  Considering the very clear external swelling, erythema and cellulitic changes to his scrotum and overall symptoms/time course more consistent with this.  We will start him on Keflex to cover for cellulitis and possible cystitis while waiting on urine culture.  Provided referral information for urology and discussed close ED return precautions      FINAL CLINICAL IMPRESSION(S) / ED DIAGNOSES   Final diagnoses:  Cellulitis of scrotum     Rx / DC Orders   ED Discharge Orders          Ordered    cephALEXin (KEFLEX) 500 MG capsule  3 times daily        08/18/23 0615             Note:  This document was prepared using Dragon voice recognition software and may include unintentional dictation errors.   Delton Prairie, MD 08/18/23 [phone]

## 2023-08-20 LAB — URINE CULTURE: Culture: >=100000 — AB

## 2023-09-06 ENCOUNTER — Ambulatory Visit: Payer: BC Managed Care – PPO | Admitting: Urology

## 2024-07-17 ENCOUNTER — Encounter (HOSPITAL_COMMUNITY): Payer: Self-pay

## 2024-07-17 ENCOUNTER — Ambulatory Visit (HOSPITAL_COMMUNITY)
Admission: EM | Admit: 2024-07-17 | Discharge: 2024-07-17 | Disposition: A | Discharge status: Home / Self Care | Attending: Emergency Medicine | Admitting: Emergency Medicine

## 2024-07-17 DIAGNOSIS — T23212A Burn of second degree of left thumb (nail), initial encounter: Secondary | ICD-10-CM

## 2024-07-17 MED ORDER — IBUPROFEN 800 MG PO TABS
ORAL_TABLET | ORAL | Status: AC
  Filled 2024-07-17: qty 1

## 2024-07-17 MED ORDER — SILVER SULFADIAZINE 1 % EX CREA: 1.0000 | TOPICAL_CREAM | Freq: Every day | CUTANEOUS | 0 refills | Status: AC

## 2024-07-17 MED ORDER — SULFAMETHOXAZOLE-TRIMETHOPRIM 800-160 MG PO TABS: 1.0000 | ORAL_TABLET | Freq: Two times a day (BID) | ORAL | 0 refills | Status: AC

## 2024-07-17 MED ORDER — IBUPROFEN 800 MG PO TABS
800.0000 mg | ORAL_TABLET | Freq: Once | ORAL | Status: AC
  Administered 2024-07-17: 800 mg via ORAL

## 2024-07-17 MED ORDER — IBUPROFEN 600 MG PO TABS: 600.0000 mg | ORAL_TABLET | Freq: Three times a day (TID) | ORAL | 0 refills | Status: AC | PRN
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
## 2024-07-17 NOTE — Discharge Instructions (Signed)
 The most important part of managing a burn is to try to keep the blister intact.  Blisters will reabsorb on their own, typically this takes 5 to 7 days.  I recommend that you keep your blister wrapped in soft cotton batting at all times except when you need to get your hand wet.  This will cushion the blister and hopefully prevent it from busting open.  If your blister opens, the skin beneath it will be very vulnerable to infection.  For this reason, I have provided you with a prescription for an antibiotic called Bactrim that I would like for you to take twice daily.  You do not need to pick up or pay for this prescription unless your blister comes open.  I have also prescribed you another medication called Silvadene cream that you will need to apply to your open to blister, should this occur.  Silvadene cream should be applied generously to any vulnerable skin.  After you apply it, please bandage the wound as best you can to keep the Silvadene cream in place.  Silvadene cream is sort of like a second skin, it creates a barrier to bacteria and will also soothe the raw pain you may feel if the blistered does bust open.  Please monitor your thumb for signs of swelling around the blister, redness of the skin around your blister, warmth or joint pain.  If any of these occur, please return for repeat evaluation.  Thank you for visiting [facility] today.  We appreciate the opportunity to participate in your care.

## 2024-08-10 ENCOUNTER — Emergency Department
Admission: EM | Admit: 2024-08-10 | Discharge: 2024-08-10 | Disposition: A | Discharge status: Home / Self Care | Attending: Emergency Medicine | Admitting: Emergency Medicine

## 2024-08-10 ENCOUNTER — Other Ambulatory Visit: Payer: Self-pay

## 2024-08-10 ENCOUNTER — Emergency Department

## 2024-08-10 ENCOUNTER — Encounter: Payer: Self-pay | Admitting: Emergency Medicine

## 2024-08-10 DIAGNOSIS — R220 Localized swelling, mass and lump, head: Secondary | ICD-10-CM | POA: Diagnosis present

## 2024-08-10 MED ORDER — ACETAMINOPHEN 500 MG PO TABS
1000.0000 mg | ORAL_TABLET | Freq: Once | ORAL | Status: AC
  Administered 2024-08-10: 1000 mg via ORAL
  Filled 2024-08-10: qty 2

## 2024-08-10 MED ORDER — KETOROLAC TROMETHAMINE 15 MG/ML IJ SOLN
15.0000 mg | Freq: Once | INTRAMUSCULAR | Status: AC
  Administered 2024-08-10: 15 mg via INTRAMUSCULAR
  Filled 2024-08-10: qty 1

## 2024-08-10 NOTE — Discharge Instructions (Addendum)
 You should follow-up for recheck with your blood pressure with your primary care doctor return to the ER if you develop worsening symptoms or any other concerns.  You can take Tylenol  1 g every 8 hours to help with pain and ibuprofen  400 every 8 hours with food to help with pain as needed.  Continue to ice the area.  IMPRESSION: 1. No acute traumatic injury identified in the cervical spine. 2. 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 IMPRESSION: 1. No acute traumatic injury identified in the Face. 2. Retained secretions in the nasal cavity possibly indicating rhinitis.  IMPRESSION: 1. No acute traumatic injury identified. 2. Negative non-contrast head CT.
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
